# Patient Record
Sex: Female | Born: 1995 | Race: White | Hispanic: No | Marital: Single | Smoking: Never smoker
Health system: Southern US, Community
[De-identification: ages and names within clinical notes are randomized; demographics above are authoritative.]

---
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
## 2014-09-18 ENCOUNTER — Emergency Department: Payer: Self-pay | Admitting: Emergency Medicine

## 2014-09-18 LAB — URINALYSIS, COMPLETE
Bilirubin,UR: NEGATIVE
Blood: NEGATIVE
Glucose,UR: NEGATIVE mg/dL (ref 0–75)
KETONE: NEGATIVE
Leukocyte Esterase: NEGATIVE
Nitrite: NEGATIVE
PH: 6 (ref 4.5–8.0)
PROTEIN: NEGATIVE
RBC,UR: NONE SEEN /HPF (ref 0–5)
SPECIFIC GRAVITY: 1.003 (ref 1.003–1.030)
Squamous Epithelial: 2

## 2014-09-18 LAB — CBC WITH DIFFERENTIAL/PLATELET
Basophil #: 0 10*3/uL (ref 0.0–0.1)
Basophil %: 0.4 %
Eosinophil #: 0.1 10*3/uL (ref 0.0–0.7)
Eosinophil %: 0.5 %
HCT: 44.5 % (ref 35.0–47.0)
HGB: 14.8 g/dL (ref 12.0–16.0)
LYMPHS PCT: 31 %
Lymphocyte #: 3.4 10*3/uL (ref 1.0–3.6)
MCH: 29.4 pg (ref 26.0–34.0)
MCHC: 33.3 g/dL (ref 32.0–36.0)
MCV: 88 fL (ref 80–100)
MONO ABS: 0.5 x10 3/mm (ref 0.2–0.9)
Monocyte %: 4.7 %
NEUTROS PCT: 63.4 %
Neutrophil #: 6.9 10*3/uL — ABNORMAL HIGH (ref 1.4–6.5)
PLATELETS: 319 10*3/uL (ref 150–440)
RBC: 5.05 10*6/uL (ref 3.80–5.20)
RDW: 13.4 % (ref 11.5–14.5)
WBC: 10.8 10*3/uL (ref 3.6–11.0)

## 2014-09-18 LAB — COMPREHENSIVE METABOLIC PANEL
ALK PHOS: 89 U/L
Albumin: 4.4 g/dL (ref 3.8–5.6)
Anion Gap: 7 (ref 7–16)
BILIRUBIN TOTAL: 0.6 mg/dL (ref 0.2–1.0)
BUN: 11 mg/dL (ref 7–18)
CREATININE: 0.83 mg/dL (ref 0.60–1.30)
Calcium, Total: 9 mg/dL (ref 9.0–10.7)
Chloride: 106 mmol/L (ref 98–107)
Co2: 26 mmol/L (ref 21–32)
EGFR (African American): >60
EGFR (Non-African Amer.): >60
Glucose: 86 mg/dL (ref 65–99)
Osmolality: 276 (ref 275–301)
Potassium: 3.9 mmol/L (ref 3.5–5.1)
SGOT(AST): 12 U/L (ref 0–26)
SGPT (ALT): 26 U/L
Sodium: 139 mmol/L (ref 136–145)
TOTAL PROTEIN: 7.8 g/dL (ref 6.4–8.6)

## 2014-09-18 LAB — LIPASE, BLOOD: Lipase: 103 U/L (ref 73–393)

## 2014-09-18 LAB — D-DIMER(ARMC): D-Dimer: 211 ng/ml

## 2015-09-18 ENCOUNTER — Emergency Department
Admission: EM | Admit: 2015-09-18 | Discharge: 2015-09-18 | Disposition: A | Discharge status: Home / Self Care | Payer: Self-pay | Attending: Emergency Medicine | Admitting: Emergency Medicine

## 2015-09-18 DIAGNOSIS — J029 Acute pharyngitis, unspecified: Secondary | ICD-10-CM | POA: Insufficient documentation

## 2015-09-18 DIAGNOSIS — B309 Viral conjunctivitis, unspecified: Secondary | ICD-10-CM | POA: Insufficient documentation

## 2015-09-18 MED ORDER — GENTAMICIN SULFATE 0.3 % OP SOLN: 1.0000 [drp] | Freq: Four times a day (QID) | OPHTHALMIC | Status: DC

## 2015-09-18 MED ORDER — OLOPATADINE HCL 0.1 % OP SOLN: 1.0000 [drp] | Freq: Two times a day (BID) | OPHTHALMIC | Status: DC

## 2015-09-18 NOTE — Discharge Instructions (Signed)
Viral Conjunctivitis Viral conjunctivitis is an inflammation of the clear membrane that covers the white part of your eye and the inner surface of your eyelid (conjunctiva). The inflammation is caused by a viral infection. The blood vessels in the conjunctiva become inflamed, causing the eye to become red or pink, and often itchy. Viral conjunctivitis can easily be passed from one person to another (contagious). CAUSES  Viral conjunctivitis is caused by a virus. A virus is a type of contagious germ. It can be spread by touching objects that have been contaminated with the virus, such as doorknobs or towels.  SYMPTOMS  Symptoms of viral conjunctivitis may include:  1. Eye redness. 2. Tearing or watery eyes. 3. Itchy eyes. 4. Burning feeling in the eyes. 5. Clear drainage from the eye. 6. Swollen eyelids. 7. A gritty feeling in the eye. 8. Light sensitivity. DIAGNOSIS  Viral conjunctivitis may be diagnosed with a medical history and physical exam. If you have discharge from your eye, the discharge may be tested to rule out other causes of conjunctivitis.  TREATMENT  Viral conjunctivitis does not respond to medicines that kill bacteria (antibiotics). Treatment for viral conjunctivitis is directed at stopping a bacterial infection from developing in addition to the viral infection. Treatment also aims to relieve your symptoms, such as itching. This may be done with antihistamine drops or other eye medicines. HOME CARE INSTRUCTIONS 1. Take medicines only as directed by your health care provider. 2. Avoid touching or rubbing your eyes. 3. Apply a warm, clean washcloth to your eye for 10-20 minutes, 3-4 times per day. 4. If you wear contact lenses, do not wear them until the inflammation is gone and your health care provider says it is safe to wear them again. Ask your health care provider how to sterilize or replace your contact lenses before using them again. Wear glasses until you can resume  wearing contacts. 5. Avoid wearing eye makeup until the inflammation is gone. Throw away any old eye cosmetics that may be contaminated. 6. Change or wash your pillowcase every day. 7. Do not share towels or washcloths. This may spread the infection. 8. Wash your hands often with soap and water. Use paper towels to dry your hands. 9. Gently wipe away any drainage from your eye with a warm, wet washcloth or a cotton ball. 10. Be very careful to avoid touching the edge of the eyelid with the eye drop bottle or ointment tube when applying medicines to the affected eye. This will stop you from spreading the infection to the other eye or to other people. SEEK MEDICAL CARE IF:   Your symptoms do not improve with treatment.  You have increased pain.  Your vision becomes blurry.  You have a fever.  You have facial pain, redness, or swelling.  You have new symptoms.  Your symptoms get worse.   This information is not intended to replace advice given to you by your health care provider. Make sure you discuss any questions you have with your health care provider.   Document Released: 11/07/2002 Document Revised: 02/08/2006 Document Reviewed: 05/29/2014 Elsevier Interactive Patient Education 2016 [employer].  How to Use Eye Drops and Eye Ointments HOW TO APPLY EYE DROPS Follow these steps when applying eye drops: 9. Wash your hands. 10. Tilt your head back. 11. Put a finger under your eye and use it to gently pull your lower lid downward. Keep that finger in place. 12. Using your other hand, hold the dropper between your  thumb and index finger. 13. Position the dropper just over the edge of the lower lid. Hold it as close to your eye as you can without touching the dropper to your eye. 14. Steady your hand. One way to do this is to lean your index finger against your brow. 15. Look up. 16. Slowly and gently squeeze one drop of medicine into your eye. 17. Close your eye. 18. Place a  finger between your lower eyelid and your nose. Press gently for 2 minutes. This increases the amount of time that the medicine is exposed to the eye. It also reduces side effects that can develop if the drop gets into the bloodstream through the nose. HOW TO APPLY EYE OINTMENTS Follow these steps when applying eye ointments: 11. Wash your hands. 12. Put a finger under your eye and use it to gently pull your lower lid downward. Keep that finger in place. 13. Using your other hand, place the tip of the tube between your thumb and index finger with the remaining fingers braced against your cheek or nose. 14. Hold the tube just over the edge of your lower lid without touching the tube to your lid or eyeball. 15. Look up. 16. Line the inner part of your lower lid with ointment. 17. Gently pull up on your upper lid and look down. This will force the ointment to spread over the surface of the eye. 18. Release the upper lid. 19. If you can, close your eyes for 1-2 minutes. Do not rub your eyes. If you applied the ointment correctly, your vision will be blurry for a few minutes. This is normal. ADDITIONAL INFORMATION  Make sure to use the eye drops or ointment as told by your health care provider.  If you have been told to use both eye drops and an eye ointment, apply the eye drops first, then wait 3-4 minutes before you apply the ointment.  Try not to touch the tip of the dropper or tube to your eye. A dropper or tube that has touched the eye can become contaminated.   This information is not intended to replace advice given to you by your health care provider. Make sure you discuss any questions you have with your health care provider.   Document Released: 11/23/2000 Document Revised: 01/01/2015 Document Reviewed: 08/13/2014 Elsevier Interactive Patient Education [employer]

## 2015-09-18 NOTE — ED Notes (Signed)
Pt reports to ED w/ c/o L eye drainage for last 2 days.

## 2015-09-18 NOTE — ED Provider Notes (Signed)
[facility] Emergency Department Provider Note  ____________________________________________  Time seen: Approximately 5:16 PM  I have reviewed the triage vital signs and the nursing notes.   HISTORY  Chief Complaint Eye Drainage   HPI Michele Bass is a 20 y.o. female who presents to the ED with red eyes and drainage. She states that 3 days ago her left eye again to water, and since then it has been crusted and hard to open when she wakes up. She has had sore throat, but denies HA or visual changes. She has a history of pink eye episodes as a child. Patient denies any eye trauma, and does not wear contacts. Patient describes rates her pain as a 1/10.   History reviewed. No pertinent past medical history.  There are no active problems to display for this patient.   History reviewed. No pertinent past surgical history.  Current Outpatient Rx  Name  Route  Sig  Dispense  Refill  . gentamicin (GARAMYCIN) 0.3 % ophthalmic solution   Both Eyes   Place 1 drop into both eyes 4 (four) times daily.   5 mL   0   . olopatadine (PATANOL) 0.1 % ophthalmic solution   Left Eye   Place 1 drop into the left eye 2 (two) times daily.   5 mL   1     Allergies Review of patient's allergies indicates no known allergies.  No family history on file.  Social History Social History  Substance Use Topics  . Smoking status: Never Smoker   . Smokeless tobacco: None  . Alcohol Use: None    Review of Systems Constitutional: No fever/chills Eyes: No visual changes. Positive eye redness, eye discharge, and eye crusting. Patient does not wear contacts ENT: Positive sore throat. No runny nose, no congestion. No ear pain. Respiratory: No cough Gastrointestinal: No abdominal pain.  No nausea, no vomiting.  No diarrhea.  No constipation. Neurological: Negative for headaches, focal weakness or numbness. Hematological/Lymphatic:  No lymphadenopathy 10-point ROS  otherwise negative.  ____________________________________________   PHYSICAL EXAM:  VITAL SIGNS: ED Triage Vitals  Enc Vitals Group     BP 09/18/15 1656 138/90 mmHg     Pulse Rate 09/18/15 1656 94     Resp 09/18/15 1656 16     Temp 09/18/15 1656 98.7 F (37.1 C)     Temp Source 09/18/15 1656 Oral     SpO2 09/18/15 1656 99 %     Weight 09/18/15 1656 180 lb (81.647 kg)     Height 09/18/15 1656  (1.626 m)     Head Cir --      Peak Flow --      Pain Score 09/18/15 1657 1     Pain Loc --      Pain Edu? --      Excl. in GC? --     Constitutional: Alert and oriented. Well appearing and in no acute distress. Eyes:  PERRL. EOMI. funduscopic exam WDL- no cupping or papilledema. There is circumferential injection of the left eye that does not extend to the iris. Discharge noted. Head: Atraumatic. Nose: No congestion/rhinnorhea. Mouth/Throat: Mucous membranes are moist.  Oropharynx non-erythematous. Mild swelling of tonsils. Neck: No stridor.   Hematological/Lymphatic/Immunilogical: No cervical lymphadenopathy. Cardiovascular: Normal rate, regular rhythm. Grossly normal heart sounds.   Respiratory: Normal respiratory effort.  Lungs CTAB. Gastrointestinal: Soft and nontender. No distention. No abdominal bruits.  Neurologic:  Normal speech and language. No gross focal neurologic deficits  are appreciated. Marland Kitchen Psychiatric: Mood and affect are normal. Speech and behavior are normal.  ____________________________________________   LABS (all labs ordered are listed, but only abnormal results are displayed)  Labs Reviewed - No data to display ____________________________________________  EKG   ____________________________________________  RADIOLOGY   ____________________________________________   PROCEDURES  Procedure(s) performed: None  Critical Care performed: No  ____________________________________________   INITIAL IMPRESSION / ASSESSMENT AND PLAN / ED  COURSE  Pertinent labs & imaging results that were available during my care of the patient were reviewed by me and considered in my medical decision making (see chart for details).  Patient will be treated for unilateral viral conjunctivitis. She will be discharged with Patanol and gentamicin drops. She was advised that the gentamicin drops should not be filled unless symptoms do not improve in the next 5 days. She should follow-up with her primary care provider. ____________________________________________   FINAL CLINICAL IMPRESSION(S) / ED DIAGNOSES  Final diagnosis: Unilateral viral conjunctivitis    Joni Reining, PA-C 09/18/15 1756  Jennye Moccasin, MD 09/24/15 [phone]

## 2015-11-19 ENCOUNTER — Emergency Department: Payer: Self-pay

## 2015-11-19 ENCOUNTER — Emergency Department
Admission: EM | Admit: 2015-11-19 | Discharge: 2015-11-19 | Disposition: A | Discharge status: Home / Self Care | Payer: Self-pay | Attending: Emergency Medicine | Admitting: Emergency Medicine

## 2015-11-19 ENCOUNTER — Encounter: Payer: Self-pay | Admitting: Emergency Medicine

## 2015-11-19 DIAGNOSIS — S0083XA Contusion of other part of head, initial encounter: Secondary | ICD-10-CM | POA: Insufficient documentation

## 2015-11-19 DIAGNOSIS — Y929 Unspecified place or not applicable: Secondary | ICD-10-CM | POA: Insufficient documentation

## 2015-11-19 DIAGNOSIS — Y939 Activity, unspecified: Secondary | ICD-10-CM | POA: Insufficient documentation

## 2015-11-19 DIAGNOSIS — T07XXXA Unspecified multiple injuries, initial encounter: Secondary | ICD-10-CM

## 2015-11-19 DIAGNOSIS — Y999 Unspecified external cause status: Secondary | ICD-10-CM | POA: Insufficient documentation

## 2015-11-19 DIAGNOSIS — S0990XA Unspecified injury of head, initial encounter: Secondary | ICD-10-CM

## 2015-11-19 LAB — POCT PREGNANCY, URINE: PREG TEST UR: NEGATIVE

## 2015-11-19 MED ORDER — ACETAMINOPHEN 325 MG PO TABS
650.0000 mg | ORAL_TABLET | Freq: Once | ORAL | Status: AC
  Administered 2015-11-19: 650 mg via ORAL
  Filled 2015-11-19: qty 2

## 2015-11-19 NOTE — ED Notes (Signed)
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
## 2015-11-19 NOTE — ED Notes (Signed)
Breakfast tray provided   Pt observed with no unusual behavior  Appropriate to stimulation  No verbalized needs or concerns at this time  NAD assessed  Continue to monitor
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
## 2015-11-30 ENCOUNTER — Emergency Department
Admission: EM | Admit: 2015-11-30 | Discharge: 2015-11-30 | Disposition: A | Discharge status: Home / Self Care | Payer: Self-pay | Attending: Emergency Medicine | Admitting: Emergency Medicine

## 2015-11-30 ENCOUNTER — Emergency Department: Payer: Self-pay

## 2015-11-30 ENCOUNTER — Encounter: Payer: Self-pay | Admitting: Emergency Medicine

## 2015-11-30 DIAGNOSIS — S0012XA Contusion of left eyelid and periocular area, initial encounter: Secondary | ICD-10-CM | POA: Insufficient documentation

## 2015-11-30 DIAGNOSIS — Y92410 Unspecified street and highway as the place of occurrence of the external cause: Secondary | ICD-10-CM | POA: Insufficient documentation

## 2015-11-30 DIAGNOSIS — Y9389 Activity, other specified: Secondary | ICD-10-CM | POA: Insufficient documentation

## 2015-11-30 DIAGNOSIS — Z792 Long term (current) use of antibiotics: Secondary | ICD-10-CM | POA: Insufficient documentation

## 2015-11-30 DIAGNOSIS — T148XXA Other injury of unspecified body region, initial encounter: Secondary | ICD-10-CM

## 2015-11-30 DIAGNOSIS — Z79899 Other long term (current) drug therapy: Secondary | ICD-10-CM | POA: Insufficient documentation

## 2015-11-30 DIAGNOSIS — S93401A Sprain of unspecified ligament of right ankle, initial encounter: Secondary | ICD-10-CM | POA: Insufficient documentation

## 2015-11-30 DIAGNOSIS — Y998 Other external cause status: Secondary | ICD-10-CM | POA: Insufficient documentation

## 2015-11-30 DIAGNOSIS — S50811A Abrasion of right forearm, initial encounter: Secondary | ICD-10-CM | POA: Insufficient documentation

## 2015-11-30 MED ORDER — IBUPROFEN 600 MG PO TABS
600.0000 mg | ORAL_TABLET | Freq: Once | ORAL | Status: AC
  Administered 2015-11-30: 600 mg via ORAL

## 2015-11-30 MED ORDER — MORPHINE SULFATE (PF) 4 MG/ML IV SOLN
INTRAVENOUS | Status: AC
  Administered 2015-11-30: 4 mg via INTRAMUSCULAR
  Filled 2015-11-30: qty 1

## 2015-11-30 MED ORDER — IBUPROFEN 600 MG PO TABS
ORAL_TABLET | ORAL | Status: AC
  Filled 2015-11-30: qty 1

## 2015-11-30 MED ORDER — TRAMADOL HCL 50 MG PO TABS
50.0000 mg | ORAL_TABLET | Freq: Four times a day (QID) | ORAL | Status: AC | PRN
Start: 2015-11-30 — End: 2016-11-29

## 2015-11-30 MED ORDER — MORPHINE SULFATE (PF) 4 MG/ML IV SOLN
4.0000 mg | Freq: Once | INTRAVENOUS | Status: AC
Start: 2015-11-30 — End: 2015-11-30
  Administered 2015-11-30: 4 mg via INTRAMUSCULAR

## 2015-11-30 NOTE — ED Notes (Signed)
Patient transported to X-ray 
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
## 2015-11-30 NOTE — ED Notes (Signed)
Applied Ace Bandage to right ankle and instructed pt on use of crutches per MD order. Cleaned abrasion with sterile saline and dressed with non-adherent dressing and kerlex per MD order. Reviewed d/c instructions, follow-up care, prescriptions, and use of ice/elevation. Pt verbalized understanding.

## 2015-11-30 NOTE — ED Notes (Signed)
Pt reports decreased sensation in right foot when compared to left foot.

## 2015-11-30 NOTE — ED Notes (Signed)
MD Derrill KayGoodman informed of pt's pain score
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
## 2015-11-30 NOTE — Discharge Instructions (Signed)
Please seek medical attention for any high fevers, chest pain, shortness of breath, change in behavior, persistent vomiting, bloody stool or any other new or concerning symptoms.   Ankle Sprain An ankle sprain is an injury to the strong, fibrous tissues (ligaments) that hold your ankle bones together.  HOME CARE   Put ice on your ankle for 1-2 days or as told by your doctor.  Put ice in a plastic bag.  Place a towel between your skin and the bag.  Leave the ice on for 15-20 minutes at a time, every 2 hours while you are awake.  Only take medicine as told by your doctor.  Raise (elevate) your injured ankle above the level of your heart as much as possible for 2-3 days.  Use crutches if your doctor tells you to. Slowly put your own weight on the affected ankle. Use the crutches until you can walk without pain.  If you have a plaster splint:  Do not rest it on anything harder than a pillow for 24 hours.  Do not put weight on it.  Do not get it wet.  Take it off to shower or bathe.  If given, use an elastic wrap or support stocking for support. Take the wrap off if your toes lose feeling (numb), tingle, or turn cold or blue.  If you have an air splint:  Add or let out air to make it comfortable.  Take it off at night and to shower and bathe.  Wiggle your toes and move your ankle up and down often while you are wearing it. GET HELP IF:  You have rapidly increasing bruising or puffiness (swelling).  Your toes feel very cold.  You lose feeling in your foot.  Your medicine does not help your pain. GET HELP RIGHT AWAY IF:   Your toes lose feeling (numb) or turn blue.  You have severe pain that is increasing. MAKE SURE YOU:   Understand these instructions.  Will watch your condition.  Will get help right away if you are not doing well or get worse.   This information is not intended to replace advice given to you by your health care provider. Make sure you discuss  any questions you have with your health care provider.   Document Released: 02/03/2008 Document Revised: 09/07/2014 Document Reviewed: 02/29/2012 Elsevier Interactive Patient Education [employer]2016 [employer] Inc.
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
## 2023-04-27 ENCOUNTER — Emergency Department
Admission: EM | Admit: 2023-04-27 | Discharge: 2023-04-27 | Disposition: A | Discharge status: Home / Self Care | Payer: Self-pay | Attending: Emergency Medicine | Admitting: Emergency Medicine

## 2023-04-27 ENCOUNTER — Emergency Department: Payer: Self-pay

## 2023-04-27 ENCOUNTER — Other Ambulatory Visit: Payer: Self-pay

## 2023-04-27 DIAGNOSIS — W312XXA Contact with powered woodworking and forming machines, initial encounter: Secondary | ICD-10-CM | POA: Insufficient documentation

## 2023-04-27 DIAGNOSIS — S62633A Displaced fracture of distal phalanx of left middle finger, initial encounter for closed fracture: Secondary | ICD-10-CM | POA: Insufficient documentation

## 2023-04-27 DIAGNOSIS — S62633B Displaced fracture of distal phalanx of left middle finger, initial encounter for open fracture: Secondary | ICD-10-CM

## 2023-04-27 DIAGNOSIS — Z23 Encounter for immunization: Secondary | ICD-10-CM | POA: Insufficient documentation

## 2023-04-27 DIAGNOSIS — S62635B Displaced fracture of distal phalanx of left ring finger, initial encounter for open fracture: Secondary | ICD-10-CM | POA: Insufficient documentation

## 2023-04-27 MED ORDER — TETANUS-DIPHTH-ACELL PERTUSSIS 5-2.5-18.5 LF-MCG/0.5 IM SUSY
0.5000 mL | PREFILLED_SYRINGE | Freq: Once | INTRAMUSCULAR | Status: AC
  Administered 2023-04-27: 0.5 mL via INTRAMUSCULAR
  Filled 2023-04-27: qty 0.5

## 2023-04-27 MED ORDER — OXYCODONE-ACETAMINOPHEN 5-325 MG PO TABS: 1.0000 | ORAL_TABLET | Freq: Four times a day (QID) | ORAL | 0 refills | Status: AC | PRN

## 2023-04-27 MED ORDER — MORPHINE SULFATE (PF) 4 MG/ML IV SOLN
4.0000 mg | Freq: Once | INTRAVENOUS | Status: AC
  Administered 2023-04-27: 4 mg via INTRAVENOUS
  Filled 2023-04-27: qty 1

## 2023-04-27 MED ORDER — LIDOCAINE HCL (PF) 1 % IJ SOLN
30.0000 mL | Freq: Once | INTRAMUSCULAR | Status: DC
  Filled 2023-04-27: qty 30

## 2023-04-27 MED ORDER — CEFDINIR 300 MG PO CAPS: 300.0000 mg | ORAL_CAPSULE | Freq: Two times a day (BID) | ORAL | 0 refills | Status: AC

## 2023-04-27 NOTE — Discharge Instructions (Signed)
Please seek medical attention for any high fevers, chest pain, shortness of breath, change in behavior, persistent vomiting, bloody stool or any other new or concerning symptoms.  
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
## 2023-04-27 NOTE — ED Notes (Signed)
Pt fingers wrapped with a non adhesive pad on all three fingers and secured with gauze wrap.

## 2023-11-08 ENCOUNTER — Ambulatory Visit: Payer: Self-pay

## 2023-11-12 ENCOUNTER — Ambulatory Visit: Payer: Self-pay | Admitting: Nurse Practitioner

## 2023-11-12 DIAGNOSIS — Z113 Encounter for screening for infections with a predominantly sexual mode of transmission: Secondary | ICD-10-CM

## 2023-11-12 LAB — HM HEPATITIS C SCREENING LAB: HM Hepatitis Screen: NEGATIVE

## 2023-11-12 LAB — HEPATITIS B SURFACE ANTIGEN: Hepatitis B Surface Ag: NONREACTIVE

## 2023-11-12 LAB — WET PREP FOR TRICH, YEAST, CLUE
Trichomonas Exam: NEGATIVE
Yeast Exam: NEGATIVE

## 2023-11-12 LAB — HM HIV SCREENING LAB: HM HIV Screening: NEGATIVE

## 2023-11-12 NOTE — Progress Notes (Signed)
 Pt is here for STD screening.  Wet mount results reviewed, no treatment required per SO.  Berdie Ogren, RN

## 2023-11-14 NOTE — Progress Notes (Signed)
 [facility] Department STI clinic [address] [place] [address] Main phone: [phone]  STI screening visit  Subjective:  Michele Bass is a 28 y.o. female being seen today for an STI screening visit. The patient reports they do not have symptoms.  Patient reports that they do not desire a pregnancy in the next year.   They reported they are not interested in discussing contraception today.    Patient's last menstrual period was 10/03/2023.  Patient has the following medical conditions:  There are no active problems to display for this patient.   Chief Complaint  Patient presents with   SEXUALLY TRANSMITTED DISEASE    No symptoms, routine check   Patient is a pleasant 28 y.o. female who presents to the office today requesting asymptomatic STI testing. Patient indicates 1 female partner in the last 2 months. She reports practicing vaginal and oral sex and does not use condoms. Patient denies a history of STIs. Patient reports last sex was 1 day ago. She indicates no use of contraception method as she has a same sex partner.  Patient indicates LMP was 10/03/23 and has irregular periods.    Does the patient using douching products? Not assessed.  Last HIV test per patient/review of record was No results found for: "HMHIVSCREEN" No results found for: "HIV"   Last HEPC test per patient/review of record was No results found for: "HMHEPCSCREEN" No components found for: "HEPC"   Last HEPB test per patient/review of record was No components found for: "HMHEPBSCREEN"   Patient reports last pap was:  No results found for: "DIAGPAP", "HPVHIGH", "ADEQPAP" No results found for: "SPECADGYN" No Cervical Cancer Screening results to display.  Screening for MPX risk: Does the patient have an unexplained rash? No Is the patient MSM? No Does the patient endorse multiple sex partners or anonymous sex partners? No Did the patient have close or  sexual contact with a person diagnosed with MPX? No Has the patient traveled outside the [place] where MPX is endemic? No Is there a high clinical suspicion for MPX-- evidenced by one of the following No  -Unlikely to be chickenpox  -Lymphadenopathy  -Rash that present in same phase of evolution on any given body part See flowsheet for further details and programmatic requirements.   Immunization history:  Immunization History  Administered Date(s) Administered   Tdap 04/27/2023     The following portions of the patient's history were reviewed and updated as appropriate: allergies, current medications, past medical history, past social history, past surgical history and problem list.  Objective:  There were no vitals filed for this visit.  Physical Exam Nursing note reviewed.  Constitutional:      Appearance: Normal appearance.  HENT:     Head: Normocephalic.     Salivary Glands: Right salivary gland is not diffusely enlarged or tender. Left salivary gland is not diffusely enlarged or tender.     Mouth/Throat:     Lips: Pink. No lesions.     Mouth: Mucous membranes are moist.     Tongue: No lesions. Tongue does not deviate from midline.     Pharynx: Oropharynx is clear. Uvula midline. No oropharyngeal exudate or posterior oropharyngeal erythema.     Tonsils: No tonsillar exudate.  Eyes:     General:        Right eye: No discharge.        Left eye: No discharge.  Pulmonary:     Effort: Pulmonary effort  is normal.  Genitourinary:    Comments: Patient asymptomatic. Declines genital exam. Self-swabbing.  Lymphadenopathy:     Head:     Right side of head: No submental, submandibular, tonsillar, preauricular or posterior auricular adenopathy.     Left side of head: No submental, submandibular, tonsillar, preauricular or posterior auricular adenopathy.     Cervical: No cervical adenopathy.     Right cervical: No superficial or posterior cervical adenopathy.    Left cervical: No  superficial or posterior cervical adenopathy.     Upper Body:     Right upper body: No supraclavicular or axillary adenopathy.     Left upper body: No supraclavicular or axillary adenopathy.  Skin:    General: Skin is warm and dry.     Comments: Skin tone appropriate for ethnicity. Assessed exposed areas only and back.   Neurological:     Mental Status: She is alert and oriented to person, place, and time.  Psychiatric:        Attention and Perception: Attention and perception normal.        Mood and Affect: Mood and affect normal.        Speech: Speech normal.        Behavior: Behavior normal. Behavior is cooperative.        Thought Content: Thought content normal.     Assessment and Plan:  Michele Bass is a 28 y.o. female presenting to the [facility] Department for STI screening  1. Screening for venereal disease (Primary)  - Chlamydia/Gonorrhea [place] [facility] - Gonococcus culture - HIV/HCV [place] [facility] - HBV Antigen/Antibody [facility] - Syphilis Serology,  Lab - WET PREP FOR TRICH, YEAST, CLUE   Patient accepted all screenings including oral, vaginal CT/GC and bloodwork for HIV/RPR, and wet prep. Patient meets criteria for HepB screening? Yes. Ordered? yes Patient meets criteria for HepC screening? Yes. Ordered? yes  Treat wet prep per standing order Discussed time line for State Lab results and that patient will be called with positive results and encouraged patient to call if she had not heard in 2 weeks.  Counseled to return or seek care for continued or worsening symptoms Recommended repeat testing in 3 months with positive results. Recommended condom use with all sex for STI prevention.   Patient is currently using  nothing  to prevent pregnancy as she has a same-sex partner.   Return if symptoms worsen or fail to improve.  No future appointments.  Total time with patient 20 minutes.   Edmonia James, NP

## 2023-11-17 LAB — GONOCOCCUS CULTURE

## 2024-02-24 ENCOUNTER — Telehealth: Payer: Self-pay | Admitting: Licensed Clinical Social Worker

## 2024-02-24 NOTE — Telephone Encounter (Signed)
 02/23/24 Patient lvm inquiring about services.

## 2024-03-08 ENCOUNTER — Ambulatory Visit: Payer: Self-pay | Admitting: Licensed Clinical Social Worker

## 2024-05-18 ENCOUNTER — Encounter: Payer: Self-pay | Admitting: *Deleted

## 2024-05-18 ENCOUNTER — Emergency Department: Payer: Self-pay

## 2024-05-18 ENCOUNTER — Emergency Department
Admission: EM | Admit: 2024-05-18 | Discharge: 2024-05-19 | Disposition: A | Discharge status: Home / Self Care | Payer: Self-pay | Attending: Emergency Medicine | Admitting: Emergency Medicine

## 2024-05-18 ENCOUNTER — Other Ambulatory Visit: Payer: Self-pay

## 2024-05-18 DIAGNOSIS — S8991XA Unspecified injury of right lower leg, initial encounter: Secondary | ICD-10-CM | POA: Insufficient documentation

## 2024-05-18 DIAGNOSIS — W19XXXA Unspecified fall, initial encounter: Secondary | ICD-10-CM | POA: Diagnosis not present
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
## 2024-05-18 NOTE — ED Triage Notes (Signed)
 Pt brought in via ems from home.  Pt slipped on the floor and fell.  Pt has right knee pain.  Pt was wearing socks and slipped and fell.  Pt alert  speech clear.  Pt in recliner.

## 2024-05-19 MED ORDER — ONDANSETRON 4 MG PO TBDP: 4.0000 mg | ORAL_TABLET | Freq: Four times a day (QID) | ORAL | 0 refills | Status: AC | PRN

## 2024-05-19 MED ORDER — ONDANSETRON 4 MG PO TBDP
4.0000 mg | ORAL_TABLET | Freq: Once | ORAL | Status: AC
  Administered 2024-05-19: 4 mg via ORAL
  Filled 2024-05-19: qty 1

## 2024-05-19 MED ORDER — OXYCODONE-ACETAMINOPHEN 5-325 MG PO TABS
2.0000 | ORAL_TABLET | Freq: Once | ORAL | Status: AC
  Administered 2024-05-19: 2 via ORAL
  Filled 2024-05-19: qty 2

## 2024-05-19 MED ORDER — IBUPROFEN 800 MG PO TABS
800.0000 mg | ORAL_TABLET | Freq: Once | ORAL | Status: AC
  Administered 2024-05-19: 800 mg via ORAL
  Filled 2024-05-19: qty 1

## 2024-05-19 MED ORDER — IBUPROFEN 800 MG PO TABS: 800.0000 mg | ORAL_TABLET | Freq: Three times a day (TID) | ORAL | 0 refills | Status: AC | PRN

## 2024-05-19 MED ORDER — OXYCODONE HCL 5 MG PO TABS: 5.0000 mg | ORAL_TABLET | Freq: Three times a day (TID) | ORAL | 0 refills | Status: AC | PRN

## 2024-05-19 NOTE — Discharge Instructions (Addendum)

## 2024-05-19 NOTE — ED Provider Notes (Signed)
 [facility] Provider Note    Event Date/Time   First MD Initiated Contact with Patient 05/19/24 0018     (approximate)   History   Knee Injury   HPI  Michele Bass is a 28 y.o. female with no significant past medical history who presents to the emergency department after she fell injuring her right knee.  She thinks she twisted it and felt a pop.  Has had pain with bearing weight ever since.  No other injury.  No numbness, tingling, focal weakness.   History provided by patient, friend.    History reviewed. No pertinent past medical history.  History reviewed. No pertinent surgical history.  MEDICATIONS:  Prior to Admission medications   Medication Sig Start Date End Date Taking? Authorizing Provider  gentamicin  (GARAMYCIN ) 0.3 % ophthalmic solution Place 1 drop into both eyes 4 (four) times daily. 09/18/15   Claudene Tanda POUR, PA-C  olopatadine  (PATANOL) 0.1 % ophthalmic solution Place 1 drop into the left eye 2 (two) times daily. 09/18/15   Claudene Tanda POUR, PA-C    Physical Exam   Triage Vital Signs: ED Triage Vitals [05/18/24 2223]  Encounter Vitals Group     BP 107/67     Girls Systolic BP Percentile      Girls Diastolic BP Percentile      Boys Systolic BP Percentile      Boys Diastolic BP Percentile      Pulse Rate 76     Resp 18     Temp 98.5 F (36.9 C)     Temp Source Oral     SpO2 96 %     Weight 270 lb (122.5 kg)     Height 5' 4 (1.626 m)     Head Circumference      Peak Flow      Pain Score 6     Pain Loc      Pain Education      Exclude from Growth Chart     Most recent vital signs: Vitals:   05/18/24 2223  BP: 107/67  Pulse: 76  Resp: 18  Temp: 98.5 F (36.9 C)  SpO2: 96%     CONSTITUTIONAL: Alert and responds appropriately to questions. Well-appearing; well-nourished HEAD: Normocephalic, atraumatic EYES: Conjunctivae clear, pupils appear equal ENT: normal nose; moist mucous membranes NECK: Normal  range of motion CARD: Regular rate and rhythm RESP: Normal chest excursion without splinting or tachypnea; no hypoxia or respiratory distress, speaking full sentences ABD/GI: non-distended EXT: Tender to palpation diffusely over the anterior right knee with joint effusion and soft tissue swelling.  No ecchymosis.  Difficult to test for ligamentous laxity due to body habitus and patient's level of discomfort.  Compartments in the right leg are soft.  2+ right DP pulse.  Normal capillary refill. SKIN: Normal color for age and race, no rashes on exposed skin NEURO: Moves all extremities equally, normal speech, no facial asymmetry noted PSYCH: The patient's mood and manner are appropriate. Grooming and personal hygiene are appropriate.  ED Results / Procedures / Treatments   LABS: (all labs ordered are listed, but only abnormal results are displayed) Labs Reviewed - No data to display   EKG:   RADIOLOGY: My personal review and interpretation of imaging: Small avulsion fracture of the tibia.  I have personally reviewed all radiology reports. DG Knee Complete 4 Views Right Result Date: 05/18/2024 CLINICAL DATA:  injury . Pt slipped on the floor and fell. Pt has  right knee pain. Pt was wearing socks and slipped and fell. Pt alert speech clear. Pt in recliner. EXAM: RIGHT KNEE - COMPLETE 4+ VIEW COMPARISON:  X-ray right tibia fibula 11/30/2015 FINDINGS: 2 mm ossific density along the tibial intercondylar eminence of unclear etiology. Small joint effusion. No definite acute displaced fracture. No dislocation. No evidence of arthropathy or other focal bone abnormality. Soft tissues are unremarkable. IMPRESSION: 1. 2 mm ossific density along the tibial intercondylar eminence of unclear etiology-tiny avulsion fracture not excluded. 2. Small joint effusion. Electronically Signed   By: Morgane  Naveau M.D.   On: 05/18/2024 22:57     PROCEDURES:  Critical Care performed:  No   Procedures    IMPRESSION / MDM / ASSESSMENT AND PLAN / ED COURSE  I reviewed the triage vital signs and the nursing notes.   Patient here after fall, right knee injury.     DIFFERENTIAL DIAGNOSIS (includes but not limited to):   Fracture, meniscal injury, sprain, ACL tear  Patient's presentation is most consistent with acute presentation with potential threat to life or bodily function.  PLAN: X-ray reviewed and interpreted by myself and the radiologist and shows possible small avulsion fracture at the tibia.  Given joint effusion on exam and that patient felt an immediate pop and started having swelling and concern for possible ACL versus MCL tear.  Difficult to test ligamentous laxity due to patient's level of pain and her body habitus.  Compartments are soft and she is neurovascular intact distally.  Will give pain medication and place her in a knee immobilizer with crutches and give orthopedic follow-up.  Recommended rest, elevation, ice.  Patient and friend verbalized understanding.   MEDICATIONS GIVEN IN ED: Medications  oxyCODONE -acetaminophen  (PERCOCET/ROXICET) 5-325 MG per tablet 2 tablet (2 tablets Oral Given 05/19/24 0052)  ibuprofen  (ADVIL ) tablet 800 mg (800 mg Oral Given 05/19/24 0052)  ondansetron  (ZOFRAN -ODT) disintegrating tablet 4 mg (4 mg Oral Given 05/19/24 0052)     ED COURSE:  At this time, I do not feel there is any life-threatening condition present. I reviewed all nursing notes, vitals, pertinent previous records.  All lab and urine results, EKGs, imaging ordered have been independently reviewed and interpreted by myself.  I reviewed all available radiology reports from any imaging ordered this visit.  Based on my assessment, I feel the patient is safe to be discharged home without further emergent workup and can continue workup as an outpatient as needed. Discussed all findings, treatment plan as well as usual and customary return precautions.  They  verbalize understanding and are comfortable with this plan.  Outpatient follow-up has been provided as needed.  All questions have been answered.    CONSULTS:  none   OUTSIDE RECORDS REVIEWED: No prior records for review.     FINAL CLINICAL IMPRESSION(S) / ED DIAGNOSES   Final diagnoses:  Right knee injury, initial encounter     Rx / DC Orders   ED Discharge Orders          Ordered    oxyCODONE  (ROXICODONE ) 5 MG immediate release tablet  Every 8 hours PRN        05/19/24 0042    ibuprofen  (ADVIL ) 800 MG tablet  Every 8 hours PRN        05/19/24 0042    ondansetron  (ZOFRAN -ODT) 4 MG disintegrating tablet  Every 6 hours PRN        05/19/24 0042             Note:  This document was prepared using Dragon voice recognition software and may include unintentional dictation errors.   Kyrianna Barletta, Josette SAILOR, DO 05/19/24 [phone]

## 2024-06-27 ENCOUNTER — Other Ambulatory Visit: Payer: Self-pay | Admitting: Orthopedic Surgery

## 2024-06-27 DIAGNOSIS — M25561 Pain in right knee: Secondary | ICD-10-CM

## 2024-06-27 DIAGNOSIS — S8391XD Sprain of unspecified site of right knee, subsequent encounter: Secondary | ICD-10-CM

## 2024-06-27 DIAGNOSIS — S83004D Unspecified dislocation of right patella, subsequent encounter: Secondary | ICD-10-CM

## 2024-07-03 ENCOUNTER — Ambulatory Visit
Admission: RE | Admit: 2024-07-03 | Discharge: 2024-07-03 | Disposition: A | Discharge status: Home / Self Care | Source: Ambulatory Visit | Attending: Orthopedic Surgery | Admitting: Orthopedic Surgery

## 2024-07-03 DIAGNOSIS — M25561 Pain in right knee: Secondary | ICD-10-CM

## 2024-07-03 DIAGNOSIS — S8391XD Sprain of unspecified site of right knee, subsequent encounter: Secondary | ICD-10-CM

## 2024-07-03 DIAGNOSIS — S83004D Unspecified dislocation of right patella, subsequent encounter: Secondary | ICD-10-CM

## 2024-07-15 HISTORY — PX: KNEE SURGERY: SHX244

## 2024-08-28 ENCOUNTER — Encounter: Payer: Self-pay | Admitting: Nurse Practitioner

## 2024-08-28 ENCOUNTER — Ambulatory Visit (INDEPENDENT_AMBULATORY_CARE_PROVIDER_SITE_OTHER): Payer: Self-pay | Admitting: Nurse Practitioner

## 2024-08-28 VITALS — BP 110/76 | HR 73 | Temp 97.3°F | Ht 64.0 in | Wt 279.0 lb

## 2024-08-28 DIAGNOSIS — N926 Irregular menstruation, unspecified: Secondary | ICD-10-CM

## 2024-08-28 DIAGNOSIS — Z131 Encounter for screening for diabetes mellitus: Secondary | ICD-10-CM

## 2024-08-28 DIAGNOSIS — Z6841 Body Mass Index (BMI) 40.0 and over, adult: Secondary | ICD-10-CM | POA: Diagnosis not present

## 2024-08-28 DIAGNOSIS — L678 Other hair color and hair shaft abnormalities: Secondary | ICD-10-CM

## 2024-08-28 DIAGNOSIS — Z130 Encounter for screening for diseases of the blood and blood-forming organs and certain disorders involving the immune mechanism: Secondary | ICD-10-CM

## 2024-08-28 DIAGNOSIS — Z7689 Persons encountering health services in other specified circumstances: Secondary | ICD-10-CM | POA: Diagnosis not present

## 2024-08-28 DIAGNOSIS — Z13220 Encounter for screening for lipoid disorders: Secondary | ICD-10-CM | POA: Diagnosis not present

## 2024-08-28 DIAGNOSIS — E66813 Obesity, class 3: Secondary | ICD-10-CM | POA: Diagnosis not present

## 2024-08-28 NOTE — Progress Notes (Signed)
 "  BP 110/76   Pulse 73   Temp (!) 97.3 F (36.3 C)   Ht 5' 4 (1.626 m)   Wt 279 lb (126.6 kg)   LMP 06/14/2024 (Approximate)   SpO2 98%   BMI 47.89 kg/m    Subjective:    Patient ID: Michele Bass, female    DOB: 1995/09/10, 28 y.o.   MRN: [id number]  HPI: Michele Bass is a 28 y.o. female  Chief Complaint  Patient presents with   Establish Care    Pt has concerns about PCOS and her vertigo.    Discussed the use of AI scribe software for clinical note transcription with the patient, who gave verbal consent to proceed.  History of Present Illness Michele Bass is a 28 year old female who presents to establish care with concerns of PCOS and vertigo.  Menstrual irregularity and hyperandrogenism - Irregular menstrual cycles with extremely heavy bleeding during periods - Heavy bleeding requires frequent changes of sanitary products - Menstrual symptoms impact ability to work, especially in previous holiday representative job - Excessive facial and body hair noted - Difficulty losing weight despite dieting and exercising - Family history significant for polycystic ovary syndrome  Nausea and headaches associated with motion and visual stimuli - Nausea and headaches occur when sitting in the backseat of a car or looking at a computer screen for extended periods - Unable to ride amusement park rides due to symptoms - Requires special glasses when working on a computer to alleviate symptoms - No dizziness reported  Obesity - Weight is 279 pounds - Body mass index is [employer] Visit from 08/28/2024 in [facility]  1 55 inches   -reports difficulty with losing weight despite dieting and exercising  Postoperative status following knee ligament reconstruction - Five weeks post-operative from anterior cruciate ligament, medial collateral ligament, and posterior cruciate ligament reconstruction - Currently undergoing  physical therapy - Expected recovery time is approximately one year         08/28/2024    9:10 AM  Depression screen PHQ 2/9  Decreased Interest 0  Down, Depressed, Hopeless 0  PHQ - 2 Score 0  Altered sleeping 0  Tired, decreased energy 0  Change in appetite 0  Feeling bad or failure about yourself  0  Trouble concentrating 0  Moving slowly or fidgety/restless 0  Suicidal thoughts 0  PHQ-9 Score 0    Relevant past medical, surgical, family and social history reviewed and updated as indicated. Interim medical history since our last visit reviewed. Allergies and medications reviewed and updated.  Review of Systems  Ten systems reviewed and is negative except as mentioned in HPI      Objective:      BP 110/76   Pulse 73   Temp (!) 97.3 F (36.3 C)   Ht 5' 4 (1.626 m)   Wt 279 lb (126.6 kg)   LMP 06/14/2024 (Approximate)   SpO2 98%   BMI 47.89 kg/m    Wt Readings from Last 3 Encounters:  08/28/24 279 lb (126.6 kg)  05/18/24 270 lb (122.5 kg)  04/27/23 200 lb (90.7 kg)    Physical Exam VITALS: BP- 110/76 MEASUREMENTS: Weight- 279, BMI- 47.89. GENERAL: Alert, cooperative, well developed, no acute distress HEENT: Normocephalic, normal oropharynx, moist mucous membranes NECK: Neck examined, inferred normal. CHEST: Clear to auscultation bilaterally, no wheezes, rhonchi, or crackles CARDIOVASCULAR: Normal heart rate and rhythm, S1 and S2 normal without  murmurs ABDOMEN: Soft, non-tender, non-distended, without organomegaly, normal bowel sounds EXTREMITIES: No cyanosis or edema NEUROLOGICAL: Cranial nerves grossly intact, moves all extremities without gross motor or sensory deficit  Results for orders placed or performed in visit on 11/22/23  [facility] HIV SCREENING LAB   Collection Time: 11/12/23 12:00 AM  Result Value Ref Range   [facility] HIV Screening Negative - Validated   [facility] HEPATITIS C SCREENING LAB   Collection Time: 11/12/23 12:00 AM  Result Value Ref Range   [facility]  Hepatitis Screen Negative-Validated           Assessment & Plan:   Problem List Items Addressed This Visit       Other   Class 3 severe obesity due to excess calories without serious comorbidity with body mass index (BMI) of 45.0 to 49.9 in adult [facility]) - Primary   Relevant Orders   TSH   Other Visit Diagnoses       Encounter to establish care         Screening for deficiency anemia       Relevant Orders   CBC with Differential/Platelet     Screening for cholesterol level       Relevant Orders   Lipid panel     Screening for diabetes mellitus       Relevant Orders   Comprehensive metabolic panel with GFR   Hemoglobin A1c     Irregular periods       Relevant Orders   TSH   FSH/LH   Testosterone   Insulin, Free (Bioactive)     Abnormal facial hair       Relevant Orders   TSH   FSH/LH   Testosterone   Insulin, Free (Bioactive)        Assessment and Plan Assessment & Plan Evaluation for polycystic ovary syndrome Irregular menstrual cycles, hirsutism, and difficulty losing weight suggest PCOS. Family history of PCOS. Differential diagnosis includes thyroid dysfunction. - Ordered lab work to evaluate for PCOS and thyroid function - Discussed potential treatment options including metformin and birth control if PCOS is confirmed  Class 3 severe obesity BMI of 47.89. Difficulty losing weight despite dietary efforts. Potential insulin resistance associated with PCOS discussed. - Discussed potential use of metformin for insulin resistance if PCOS is confirmed - Encourage continuation of lifestyle modifications, including dietary management and regular exercise. -continue to increase physical activity, getting at least 150 min of physical activity a week.  Work on including runner, broadcasting/film/video 2 days a week.  - continue eating at a calorie deficit 1600-1800 cal a day, eating a well balanced diet with whole foods, avoiding processed foods.   Patient is motivated to continue  working on lifestyle modification.    Car sickness Symptoms consistent with car sickness, including nausea and headache when in the back seat of a car or watching screens for extended periods. No dizziness reported. Symptoms not consistent with vertigo. - Prescribed scopolamine patches for upcoming trip to [place] - Advised on the use of screen filters to reduce screen-related symptoms  General health maintenance Discussion of general health maintenance and the importance of regular check-ups.        Follow up plan: Return in about 6 months (around 02/26/2025) for cpe. "

## 2024-08-29 ENCOUNTER — Ambulatory Visit: Payer: Self-pay | Admitting: Nurse Practitioner

## 2024-09-01 LAB — COMPREHENSIVE METABOLIC PANEL WITH GFR
AG Ratio: 1.9 (calc) (ref 1.0–2.5)
ALT: 13 U/L (ref 6–29)
AST: 10 U/L (ref 10–30)
Albumin: 4.5 g/dL (ref 3.6–5.1)
Alkaline phosphatase (APISO): 93 U/L (ref 31–125)
BUN: 15 mg/dL (ref 7–25)
CO2: 26 mmol/L (ref 20–32)
Calcium: 9.5 mg/dL (ref 8.6–10.2)
Chloride: 104 mmol/L (ref 98–110)
Creat: 0.57 mg/dL (ref 0.50–0.96)
Globulin: 2.4 g/dL (ref 1.9–3.7)
Glucose, Bld: 112 mg/dL — ABNORMAL HIGH (ref 65–99)
Potassium: 4.3 mmol/L (ref 3.5–5.3)
Sodium: 140 mmol/L (ref 135–146)
Total Bilirubin: 0.5 mg/dL (ref 0.2–1.2)
Total Protein: 6.9 g/dL (ref 6.1–8.1)
eGFR: 126 mL/min/1.73m2

## 2024-09-01 LAB — CBC WITH DIFFERENTIAL/PLATELET
Absolute Lymphocytes: 2472 {cells}/uL (ref 850–3900)
Absolute Monocytes: 363 {cells}/uL (ref 200–950)
Basophils Absolute: 37 {cells}/uL (ref 0–200)
Basophils Relative: 0.5 %
Eosinophils Absolute: 81 {cells}/uL (ref 15–500)
Eosinophils Relative: 1.1 %
HCT: 40.9 % (ref 35.9–46.0)
Hemoglobin: 13.4 g/dL (ref 11.7–15.5)
MCH: 27.8 pg (ref 27.0–33.0)
MCHC: 32.8 g/dL (ref 31.6–35.4)
MCV: 84.9 fL (ref 81.4–101.7)
MPV: 10.4 fL (ref 7.5–12.5)
Monocytes Relative: 4.9 %
Neutro Abs: 4447 {cells}/uL (ref 1500–7800)
Neutrophils Relative %: 60.1 %
Platelets: 313 Thousand/uL (ref 140–400)
RBC: 4.82 Million/uL (ref 3.80–5.10)
RDW: 12.6 % (ref 11.0–15.0)
Total Lymphocyte: 33.4 %
WBC: 7.4 Thousand/uL (ref 3.8–10.8)

## 2024-09-01 LAB — HEMOGLOBIN A1C
Hgb A1c MFr Bld: 5 %
Mean Plasma Glucose: 97 mg/dL
eAG (mmol/L): 5.4 mmol/L

## 2024-09-01 LAB — LIPID PANEL
Cholesterol: 178 mg/dL
HDL: 50 mg/dL
LDL Cholesterol (Calc): 101 mg/dL — ABNORMAL HIGH
Non-HDL Cholesterol (Calc): 128 mg/dL
Total CHOL/HDL Ratio: 3.6 (calc)
Triglycerides: 171 mg/dL — ABNORMAL HIGH

## 2024-09-01 LAB — FSH/LH
FSH: 5.8 m[IU]/mL
LH: 6.3 m[IU]/mL

## 2024-09-01 LAB — INSULIN, FREE (BIOACTIVE): Insulin, Free: 45.2 u[IU]/mL — ABNORMAL HIGH (ref 1.5–14.9)

## 2024-09-01 LAB — TESTOSTERONE, TOTAL, LC/MS/MS: Testosterone, Total, LC-MS-MS: 29 ng/dL (ref 2–45)

## 2024-09-01 LAB — TSH: TSH: 1.98 m[IU]/L

## 2024-09-12 ENCOUNTER — Ambulatory Visit

## 2024-09-12 DIAGNOSIS — Z111 Encounter for screening for respiratory tuberculosis: Secondary | ICD-10-CM

## 2024-09-14 ENCOUNTER — Ambulatory Visit (LOCAL_COMMUNITY_HEALTH_CENTER)

## 2024-09-14 DIAGNOSIS — Z111 Encounter for screening for respiratory tuberculosis: Secondary | ICD-10-CM

## 2024-09-14 LAB — TB SKIN TEST
Induration: 0 mm
TB Skin Test: NEGATIVE

## 2025-02-27 ENCOUNTER — Encounter: Admitting: Nurse Practitioner
